# Patient Record
Sex: Male | Born: 1975 | Race: White | Hispanic: No | Marital: Married | Smoking: Never smoker
Health system: Southern US, Community
[De-identification: ages and names within clinical notes are randomized; demographics above are authoritative.]

## PROBLEM LIST (undated history)

## (undated) DIAGNOSIS — S060X9A Concussion with loss of consciousness of unspecified duration, initial encounter: Secondary | ICD-10-CM

## (undated) DIAGNOSIS — S060XAA Concussion with loss of consciousness status unknown, initial encounter: Secondary | ICD-10-CM

## (undated) HISTORY — PX: OTHER SURGICAL HISTORY: SHX169

---

## 2001-07-24 HISTORY — PX: MASTECTOMY: SHX3

## 2007-01-14 ENCOUNTER — Ambulatory Visit: Payer: Self-pay | Admitting: Family Medicine

## 2007-03-11 DIAGNOSIS — F329 Major depressive disorder, single episode, unspecified: Secondary | ICD-10-CM | POA: Insufficient documentation

## 2008-02-17 ENCOUNTER — Ambulatory Visit: Payer: Self-pay | Admitting: Family Medicine

## 2008-02-17 DIAGNOSIS — J1189 Influenza due to unidentified influenza virus with other manifestations: Secondary | ICD-10-CM | POA: Insufficient documentation

## 2008-09-28 ENCOUNTER — Telehealth: Payer: Self-pay | Admitting: Family Medicine

## 2008-09-29 ENCOUNTER — Ambulatory Visit: Payer: Self-pay | Admitting: Family Medicine

## 2008-09-29 DIAGNOSIS — J019 Acute sinusitis, unspecified: Secondary | ICD-10-CM | POA: Insufficient documentation

## 2008-09-29 DIAGNOSIS — G510 Bell's palsy: Secondary | ICD-10-CM

## 2008-10-05 ENCOUNTER — Ambulatory Visit: Payer: Self-pay | Admitting: Family Medicine

## 2008-10-06 ENCOUNTER — Telehealth: Payer: Self-pay | Admitting: Family Medicine

## 2008-10-19 ENCOUNTER — Ambulatory Visit: Payer: Self-pay | Admitting: Family Medicine

## 2008-12-09 ENCOUNTER — Ambulatory Visit: Payer: Self-pay | Admitting: Family Medicine

## 2008-12-09 LAB — CONVERTED CEMR LAB
Nitrite: NEGATIVE
Urobilinogen, UA: 0.2
WBC Urine, dipstick: NEGATIVE

## 2008-12-10 ENCOUNTER — Telehealth: Payer: Self-pay | Admitting: Family Medicine

## 2008-12-11 LAB — CONVERTED CEMR LAB
AST: 25 units/L (ref 0–37)
Albumin: 4 g/dL (ref 3.5–5.2)
Alkaline Phosphatase: 49 units/L (ref 39–117)
Basophils Relative: 0.1 % (ref 0.0–3.0)
CO2: 30 meq/L (ref 19–32)
Calcium: 9.3 mg/dL (ref 8.4–10.5)
Chloride: 108 meq/L (ref 96–112)
Eosinophils Absolute: 0.1 10*3/uL (ref 0.0–0.7)
Hemoglobin: 16.2 g/dL (ref 13.0–17.0)
Lymphocytes Relative: 38.6 % (ref 12.0–46.0)
MCHC: 34.5 g/dL (ref 30.0–36.0)
Neutro Abs: 3.5 10*3/uL (ref 1.4–7.7)
Potassium: 4 meq/L (ref 3.5–5.1)
RBC: 5.22 M/uL (ref 4.22–5.81)
Sodium: 142 meq/L (ref 135–145)
Total CHOL/HDL Ratio: 5
Total Protein: 7.3 g/dL (ref 6.0–8.3)

## 2008-12-16 ENCOUNTER — Ambulatory Visit: Payer: Self-pay | Admitting: Family Medicine

## 2009-01-04 ENCOUNTER — Encounter: Payer: Self-pay | Admitting: Family Medicine

## 2009-03-19 ENCOUNTER — Ambulatory Visit: Payer: Self-pay | Admitting: Family Medicine
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
## 2015-01-26 ENCOUNTER — Emergency Department (HOSPITAL_COMMUNITY)
Admission: EM | Admit: 2015-01-26 | Discharge: 2015-01-27 | Disposition: A | Discharge status: Home / Self Care | Payer: Self-pay | Attending: Emergency Medicine | Admitting: Emergency Medicine

## 2015-01-26 ENCOUNTER — Encounter (HOSPITAL_COMMUNITY): Payer: Self-pay

## 2015-01-26 DIAGNOSIS — H61122 Hematoma of pinna, left ear: Secondary | ICD-10-CM | POA: Insufficient documentation

## 2015-01-26 DIAGNOSIS — R000 Tachycardia, unspecified: Secondary | ICD-10-CM | POA: Insufficient documentation

## 2015-01-26 DIAGNOSIS — Y998 Other external cause status: Secondary | ICD-10-CM | POA: Insufficient documentation

## 2015-01-26 DIAGNOSIS — S50811A Abrasion of right forearm, initial encounter: Secondary | ICD-10-CM | POA: Insufficient documentation

## 2015-01-26 DIAGNOSIS — T1490XA Injury, unspecified, initial encounter: Secondary | ICD-10-CM

## 2015-01-26 DIAGNOSIS — R55 Syncope and collapse: Secondary | ICD-10-CM | POA: Insufficient documentation

## 2015-01-26 DIAGNOSIS — T148XXA Other injury of unspecified body region, initial encounter: Secondary | ICD-10-CM

## 2015-01-26 DIAGNOSIS — S00511A Abrasion of lip, initial encounter: Secondary | ICD-10-CM | POA: Insufficient documentation

## 2015-01-26 DIAGNOSIS — S60512A Abrasion of left hand, initial encounter: Secondary | ICD-10-CM | POA: Insufficient documentation

## 2015-01-26 DIAGNOSIS — S0083XA Contusion of other part of head, initial encounter: Secondary | ICD-10-CM

## 2015-01-26 DIAGNOSIS — Y9301 Activity, walking, marching and hiking: Secondary | ICD-10-CM | POA: Insufficient documentation

## 2015-01-26 DIAGNOSIS — S0081XA Abrasion of other part of head, initial encounter: Secondary | ICD-10-CM | POA: Insufficient documentation

## 2015-01-26 DIAGNOSIS — Z23 Encounter for immunization: Secondary | ICD-10-CM | POA: Insufficient documentation

## 2015-01-26 DIAGNOSIS — Y92524 Gas station as the place of occurrence of the external cause: Secondary | ICD-10-CM | POA: Insufficient documentation

## 2015-01-26 HISTORY — DX: Concussion with loss of consciousness status unknown, initial encounter: S06.0XAA

## 2015-01-26 HISTORY — DX: Concussion with loss of consciousness of unspecified duration, initial encounter: S06.0X9A

## 2015-01-26 MED ORDER — OXYCODONE-ACETAMINOPHEN 5-325 MG PO TABS
2.0000 | ORAL_TABLET | Freq: Once | ORAL | Status: AC
  Administered 2015-01-27: 2 via ORAL
  Filled 2015-01-26: qty 2

## 2015-01-26 MED ORDER — TETANUS-DIPHTH-ACELL PERTUSSIS 5-2.5-18.5 LF-MCG/0.5 IM SUSP
0.5000 mL | Freq: Once | INTRAMUSCULAR | Status: AC
  Administered 2015-01-27: 0.5 mL via INTRAMUSCULAR
  Filled 2015-01-26: qty 0.5
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
## 2015-01-26 NOTE — ED Notes (Signed)
Patient transported to X-ray & CT °

## 2015-01-27 ENCOUNTER — Encounter (HOSPITAL_COMMUNITY): Payer: Self-pay

## 2015-01-27 ENCOUNTER — Emergency Department (HOSPITAL_COMMUNITY): Payer: Self-pay

## 2015-01-27 MED ORDER — OXYCODONE-ACETAMINOPHEN 5-325 MG PO TABS: 1.0000 | ORAL_TABLET | Freq: Four times a day (QID) | ORAL | Status: AC | PRN

## 2015-01-27 NOTE — Discharge Instructions (Signed)
Abrasion °An abrasion is a cut or scrape of the skin. Abrasions do not extend through all layers of the skin and most heal within 10 days. It is important to care for your abrasion properly to prevent infection. °CAUSES  °Most abrasions are caused by falling on, or gliding across, the ground or other surface. When your skin rubs on something, the outer and inner layer of skin rubs off, causing an abrasion. °DIAGNOSIS  °Your caregiver will be able to diagnose an abrasion during a physical exam.  °TREATMENT  °Your treatment depends on how large and deep the abrasion is. Generally, your abrasion will be cleaned with water and a mild soap to remove any dirt or debris. An antibiotic ointment may be put over the abrasion to prevent an infection. A bandage (dressing) may be wrapped around the abrasion to keep it from getting dirty.  °You may need a tetanus shot if: °· You cannot remember when you had your last tetanus shot. °· You have never had a tetanus shot. °· The injury broke your skin. °If you get a tetanus shot, your arm may swell, get red, and feel warm to the touch. This is common and not a problem. If you need a tetanus shot and you choose not to have one, there is a rare chance of getting tetanus. Sickness from tetanus can be serious.  °HOME CARE INSTRUCTIONS  °· If a dressing was applied, change it at least once a day or as directed by your caregiver. If the bandage sticks, soak it off with warm water.   °· Wash the area with water and a mild soap to remove all the ointment 2 times a day. Rinse off the soap and pat the area dry with a clean towel.   °· Reapply any ointment as directed by your caregiver. This will help prevent infection and keep the bandage from sticking. Use gauze over the wound and under the dressing to help keep the bandage from sticking.   °· Change your dressing right away if it becomes wet or dirty.   °· Only take over-the-counter or prescription medicines for pain, discomfort, or fever as  directed by your caregiver.   °· Follow up with your caregiver within 24-48 hours for a wound check, or as directed. If you were not given a wound-check appointment, look closely at your abrasion for redness, swelling, or pus. These are signs of infection. °SEEK IMMEDIATE MEDICAL CARE IF:  °· You have increasing pain in the wound.   °· You have redness, swelling, or tenderness around the wound.   °· You have pus coming from the wound.   °· You have a fever or persistent symptoms for more than 2-3 days. °· You have a fever and your symptoms suddenly get worse. °· You have a bad smell coming from the wound or dressing.   °MAKE SURE YOU:  °· Understand these instructions. °· Will watch your condition. °· Will get help right away if you are not doing well or get worse. °Document Released: 04/19/2005 Document Revised: 06/26/2012 Document Reviewed: 06/13/2011 °ExitCare® Patient Information ©2015 [employer]. This information is not intended to replace advice given to you by your health care provider. Make sure you discuss any questions you have with your health care provider. ° °Assault, General °Assault includes any behavior, whether intentional or reckless, which results in bodily injury to another person and/or damage to property. Included in this would be any behavior, intentional or reckless, that by its nature would be understood (interpreted) by a reasonable person as   intent to harm another person or to damage his/her property. Threats may be oral or written. They may be communicated through regular mail, computer, fax, or phone. These threats may be direct or implied. FORMS OF ASSAULT INCLUDE:  Physically assaulting a person. This includes physical threats to inflict physical harm as well as:  Slapping.  Hitting.  Poking.  Kicking.  Punching.  Pushing.  Arson.  Sabotage.  Equipment vandalism.  Damaging or destroying property.  Throwing or hitting objects.  Displaying a weapon or an  object that appears to be a weapon in a threatening manner.  Carrying a firearm of any kind.  Using a weapon to harm someone.  Using greater physical size/strength to intimidate another.  Making intimidating or threatening gestures.  Bullying.  Hazing.  Intimidating, threatening, hostile, or abusive language directed toward another person.  It communicates the intention to engage in violence against that person. And it leads a reasonable person to expect that violent behavior may occur.  Stalking another person. IF IT HAPPENS AGAIN:  Immediately call for emergency help (911 in [place]  If someone poses clear and immediate danger to you, seek legal authorities to have a protective or restraining order put in place.  Less threatening assaults can at least be reported to authorities. STEPS TO TAKE IF A SEXUAL ASSAULT HAS HAPPENED  Go to an area of safety. This may include a shelter or staying with a friend. Stay away from the area where you have been attacked. A large percentage of sexual assaults are caused by a friend, relative or associate.  If medications were given by your caregiver, take them as directed for the full length of time prescribed.  Only take over-the-counter or prescription medicines for pain, discomfort, or fever as directed by your caregiver.  If you have come in contact with a sexual disease, find out if you are to be tested again. If your caregiver is concerned about the HIV/AIDS virus, he/she may require you to have continued testing for several months.  For the protection of your privacy, test results can not be given over the phone. Make sure you receive the results of your test. If your test results are not back during your visit, make an appointment with your caregiver to find out the results. Do not assume everything is normal if you have not heard from your caregiver or the medical facility. It is important for you to follow up on all of your test  results.  File appropriate papers with authorities. This is important in all assaults, even if it has occurred in a family or by a friend. SEEK MEDICAL CARE IF:  You have new problems because of your injuries.  You have problems that may be because of the medicine you are taking, such as:  Rash.  Itching.  Swelling.  Trouble breathing.  You develop belly (abdominal) pain, feel sick to your stomach (nausea) or are vomiting.  You begin to run a temperature.  You need supportive care or referral to a rape crisis center. These are centers with trained personnel who can help you get through this ordeal. SEEK IMMEDIATE MEDICAL CARE IF:  You are afraid of being threatened, beaten, or abused. In [place] call 911.  You receive new injuries related to abuse.  You develop severe pain in any area injured in the assault or have any change in your condition that concerns you.  You faint or lose consciousness.  You develop chest pain or shortness of breath. Document  Released: 07/10/2005 Document Revised: 10/02/2011 Document Reviewed: 02/26/2008 [facility] Patient Information 2015 [employer], [employer]. This information is not intended to replace advice given to you by your health care provider. Make sure you discuss any questions you have with your health care provider. At this time, you not shiny side of concussion, but you've been given a list of symptoms to watch for the next several days U been given a prescription for Percocet to help control your discomfort.  Please take these 2 tissue sleep cocaine use ibuprofen in between.  Return anytime there is a change in your overall health of the next several days
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
